# Patient Record
Sex: Male | Born: 1980 | Race: White | Hispanic: No | Marital: Married | Smoking: Current some day smoker
Health system: Southern US, Community
[De-identification: ages and names within clinical notes are randomized; demographics above are authoritative.]

---

## 2010-02-02 ENCOUNTER — Ambulatory Visit: Payer: Self-pay | Admitting: Internal Medicine

## 2011-04-18 ENCOUNTER — Ambulatory Visit: Payer: Self-pay | Admitting: Gastroenterology

## 2013-12-26 ENCOUNTER — Ambulatory Visit: Payer: Self-pay | Admitting: Neurology

## 2017-11-20 DIAGNOSIS — I456 Pre-excitation syndrome: Secondary | ICD-10-CM | POA: Insufficient documentation

## 2017-11-20 DIAGNOSIS — Z9889 Other specified postprocedural states: Secondary | ICD-10-CM | POA: Insufficient documentation

## 2018-04-27 DIAGNOSIS — S8011XA Contusion of right lower leg, initial encounter: Secondary | ICD-10-CM | POA: Insufficient documentation

## 2020-11-03 ENCOUNTER — Other Ambulatory Visit: Payer: Self-pay

## 2020-11-03 DIAGNOSIS — Z20822 Contact with and (suspected) exposure to covid-19: Secondary | ICD-10-CM

## 2020-11-06 LAB — NOVEL CORONAVIRUS, NAA: SARS-CoV-2, NAA: DETECTED — AB

## 2021-02-24 ENCOUNTER — Other Ambulatory Visit: Payer: Self-pay

## 2021-02-24 ENCOUNTER — Emergency Department
Admission: EM | Admit: 2021-02-24 | Discharge: 2021-02-24 | Disposition: A | Discharge status: Home / Self Care | Payer: BC Managed Care – PPO | Attending: Emergency Medicine | Admitting: Emergency Medicine

## 2021-02-24 ENCOUNTER — Emergency Department: Payer: BC Managed Care – PPO

## 2021-02-24 ENCOUNTER — Encounter: Payer: Self-pay | Admitting: Intensive Care

## 2021-02-24 DIAGNOSIS — H538 Other visual disturbances: Secondary | ICD-10-CM | POA: Diagnosis not present

## 2021-02-24 DIAGNOSIS — R499 Unspecified voice and resonance disorder: Secondary | ICD-10-CM | POA: Diagnosis not present

## 2021-02-24 DIAGNOSIS — R55 Syncope and collapse: Secondary | ICD-10-CM | POA: Diagnosis not present

## 2021-02-24 DIAGNOSIS — F17290 Nicotine dependence, other tobacco product, uncomplicated: Secondary | ICD-10-CM | POA: Insufficient documentation

## 2021-02-24 LAB — BASIC METABOLIC PANEL
Anion gap: 8 (ref 5–15)
BUN: 12 mg/dL (ref 6–20)
CO2: 25 mmol/L (ref 22–32)
Calcium: 9.9 mg/dL (ref 8.9–10.3)
Chloride: 104 mmol/L (ref 98–111)
Creatinine, Ser: 1.06 mg/dL (ref 0.61–1.24)
GFR, Estimated: >60 mL/min (ref >60)
Glucose, Bld: 110 mg/dL — ABNORMAL HIGH (ref 70–99)
Potassium: 4.2 mmol/L (ref 3.5–5.1)
Sodium: 137 mmol/L (ref 135–145)

## 2021-02-24 LAB — URINALYSIS, COMPLETE (UACMP) WITH MICROSCOPIC
Bacteria, UA: NONE SEEN
Bilirubin Urine: NEGATIVE
Glucose, UA: NEGATIVE mg/dL
Hgb urine dipstick: NEGATIVE
Ketones, ur: NEGATIVE mg/dL
Leukocytes,Ua: NEGATIVE
Nitrite: NEGATIVE
Protein, ur: NEGATIVE mg/dL
Specific Gravity, Urine: 1.016 (ref 1.005–1.030)
Squamous Epithelial / HPF: NONE SEEN (ref 0–5)
pH: 6 (ref 5.0–8.0)

## 2021-02-24 LAB — CBC
HCT: 47.3 % (ref 39.0–52.0)
Hemoglobin: 16.2 g/dL (ref 13.0–17.0)
MCH: 29.4 pg (ref 26.0–34.0)
MCHC: 34.2 g/dL (ref 30.0–36.0)
MCV: 85.8 fL (ref 80.0–100.0)
Platelets: 218 10*3/uL (ref 150–400)
RBC: 5.51 MIL/uL (ref 4.22–5.81)
RDW: 11.5 % (ref 11.5–15.5)
WBC: 4.5 10*3/uL (ref 4.0–10.5)
nRBC: 0 % (ref 0.0–0.2)

## 2021-02-24 LAB — TROPONIN I (HIGH SENSITIVITY)
Troponin I (High Sensitivity): <2 ng/L (ref <18)
Troponin I (High Sensitivity): 2 ng/L (ref <18)

## 2021-02-24 NOTE — ED Notes (Signed)
See trige note. Pt c/o knee pain from the fall. No bleeding or obvious deformity noted. Pt ambulatory to bathroom/bed. Wife at bedside. Pt states he feels better after getting IVF with EMS.

## 2021-02-24 NOTE — ED Provider Notes (Signed)
[facility] Emergency Department Provider Note  ____________________________________________   Event Date/Time   First MD Initiated Contact with Patient 02/24/21 1204     (approximate)  I have reviewed the triage vital signs and the nursing notes.   HISTORY  Chief Complaint Loss of Consciousness   HPI Norman Valenzuela is a 40 y.o. male presents to the ED via EMS after a witnessed near syncopal episode at church.  Patient states that he was standing singing in church when he began feeling different.  Patient states he started seeing double and did not feel like himself.  He also states that voices were muffled before and he was able to walk out of the church because he did not feel well.  He was noted to go down to his knees where church members were with him and prevented him from hitting his head.  Patient states that he did not eat breakfast this morning and has no problems with diabetes, high blood pressure or cardiac history.  He denies any headache, shortness of breath, chest pain, dizziness, blurred vision at this time.         History reviewed. No pertinent past medical history.  There are no problems to display for this patient.   History reviewed. No pertinent surgical history.  Prior to Admission medications   Not on File    Allergies Morphine and related  History reviewed. No pertinent family history.  Social History Social History   Tobacco Use  . Smoking status: Current Some Day Smoker    Types: Cigars  . Smokeless tobacco: Never Used  Substance Use Topics  . Alcohol use: Yes    Comment: occ  . Drug use: Never    Review of Systems Constitutional: No fever/chills Eyes: No visual changes.  Initially muffled sounded voices. ENT: No sore throat. Cardiovascular: Denies chest pain. Respiratory: Denies shortness of breath.  Negative for cough. Gastrointestinal: No abdominal pain.  No nausea, no vomiting.  No diarrhea.  No  constipation. Genitourinary: Negative for dysuria. Musculoskeletal: Negative for musculoskeletal pain. Skin: Negative for rash. Neurological: Negative for headaches, focal weakness or numbness. ____________________________________________   PHYSICAL EXAM:  VITAL SIGNS: ED Triage Vitals [02/24/21 1037]  Enc Vitals Group     BP (!) 123/96     Pulse Rate 80     Resp 16     Temp (!) 97.5 F (36.4 C)     Temp Source Oral     SpO2 100 %     Weight 180 lb (81.6 kg)     Height 5\' 11"  (1.803 m)     Head Circumference      Peak Flow      Pain Score 0     Pain Loc      Pain Edu?      Excl. in GC?    Constitutional: Alert and oriented. Well appearing and in no acute distress.  Patient is unable to talk in complete sentences without any difficulties. Eyes: Conjunctivae are normal. PERRL. EOMI. no nystagmus noted. Head: Atraumatic. Nose: No congestion/rhinnorhea. Mouth/Throat: Mucous membranes are moist.  Oropharynx non-erythematous. Neck: No stridor.   Cardiovascular: Normal rate, regular rhythm. Grossly normal heart sounds.  Good peripheral circulation. Respiratory: Normal respiratory effort.  No retractions. Lungs CTAB. Gastrointestinal: Soft and nontender. No distention.  Musculoskeletal: Moves upper and lower extremities without any difficulty.  No crepitus is noted on palpation and range of motion of the knees bilaterally.  Normal gait was noted. Neurologic:  Normal speech and language. No gross focal neurologic deficits are appreciated. No gait instability. Skin:  Skin is warm, dry and intact. No rash noted. Psychiatric: Mood and affect are normal. Speech and behavior are normal.  ____________________________________________   LABS (all labs ordered are listed, but only abnormal results are displayed)  Labs Reviewed  BASIC METABOLIC PANEL - Abnormal; Notable for the following components:      Result Value   Glucose, Bld 110 (*)    All other components within normal limits   URINALYSIS, COMPLETE (UACMP) WITH MICROSCOPIC - Abnormal; Notable for the following components:   Color, Urine YELLOW (*)    APPearance CLEAR (*)    All other components within normal limits  CBC  TROPONIN I (HIGH SENSITIVITY)  TROPONIN I (HIGH SENSITIVITY)   ____________________________________________  EKG  Normal sinus rhythm with ventricular rate of 72.  PR interval 142. ____________________________________________  RADIOLOGY Beaulah Corin, personally viewed and evaluated these images (plain radiographs) as part of my medical decision making, as well as reviewing the written report by the radiologist.   Official radiology report(s): CT Head Wo Contrast  Result Date: 02/24/2021 CLINICAL DATA:  Recurrent syncope. Witnessed syncopal episode at church today. EXAM: CT HEAD WITHOUT CONTRAST TECHNIQUE: Contiguous axial images were obtained from the base of the skull through the vertex without intravenous contrast. COMPARISON:  Brain MRI, 12/26/2013. FINDINGS: Brain: No evidence of acute infarction, hemorrhage, hydrocephalus, extra-axial collection or mass lesion/mass effect. Vascular: No hyperdense vessel or unexpected calcification. Skull: Normal. Negative for fracture or focal lesion. Sinuses/Orbits: Globes and orbits are unremarkable. Visualized sinuses are clear. Other: None. IMPRESSION: Normal unenhanced CT scan of the brain. Electronically Signed   By: Amie Portland M.D.   On: 02/24/2021 12:43    ____________________________________________   PROCEDURES  Procedure(s) performed (including Critical Care):  Procedures   ____________________________________________   INITIAL IMPRESSION / ASSESSMENT AND PLAN / ED COURSE  As part of my medical decision making, I reviewed the following data within the electronic MEDICAL RECORD NUMBER Notes from prior ED visits and [place] Controlled Substance Database  40 year old male presents to the ED after a near syncopal episode that occurred  at church today.  Patient states that initially he felt sick, voices were muffled, has some blurred vision while standing singing in church.  Patient was able to walk out of the church where he landed on his knees and hurts members prevented him from hitting his head.  Wife states that church member said that this episode may have lasted all of 20 seconds.  Patient states that he ate breakfast with his children this morning but mostly was all carbs.  He has not had difficulty with his blood sugar in the past and EMS reported that his blood sugar was normal at the scene.  Labs were normal in the ED with his glucose being 110.  2 sets troponin was less than 2.  CT head scan was negative.  EKG was unremarkable.  Patient was feeling much better and was ambulatory without any assistance.  Patient was made aware that should he develop any symptoms or urgent concerns to return to the emergency department immediately.  He will follow-up with his primary care provider for any continued concerns.  ____________________________________________   FINAL CLINICAL IMPRESSION(S) / ED DIAGNOSES  Final diagnoses:  Near syncope     ED Discharge Orders    None      *Please note:  Norman Valenzuela was evaluated in Emergency Department  on 02/24/2021 for the symptoms described in the history of present illness. He was evaluated in the context of the global COVID-19 pandemic, which necessitated consideration that the patient might be at risk for infection with the SARS-CoV-2 virus that causes COVID-19. Institutional protocols and algorithms that pertain to the evaluation of patients at risk for COVID-19 are in a state of rapid change based on information released by regulatory bodies including the CDC and federal and state organizations. These policies and algorithms were followed during the patient's care in the ED.  Some ED evaluations and interventions may be delayed as a result of limited staffing during and the  pandemic.*   Note:  This document was prepared using Dragon voice recognition software and may include unintentional dictation errors.    Tommi Rumps, PA-C 02/24/21 1450    Delton Prairie, MD 02/24/21 [phone]

## 2021-02-24 NOTE — ED Triage Notes (Signed)
Patient arrived by EMS after having witnessed syncopal episode at church. Reports he started seeing double and didn't feel himself and voices became mumbles before it happened. Reports recent anxiety thinking about his father who has passed away. Denies hitting head. Church member lowered patient to the ground reporting he did hit his knees.

## 2021-02-24 NOTE — ED Notes (Signed)
Lab called at this time to add on troponin. °

## 2021-02-24 NOTE — Discharge Instructions (Addendum)
Follow-up with your primary care provider if any continued problems or concerns.  Lab work today was reassuring as well as your CT scan and you had to test called a troponin which was completely normal.  Drink plenty of fluids today.  Try to eat more protein in the mornings for breakfast to see if this helps.  Return to the emergency department immediately if any sudden worsening of your symptoms or urgent concerns.

## 2021-02-24 NOTE — ED Notes (Signed)
Pt with CT. 

## 2021-11-13 ENCOUNTER — Ambulatory Visit: Payer: BC Managed Care – PPO | Admitting: Urology

## 2021-11-13 ENCOUNTER — Encounter: Payer: Self-pay | Admitting: Urology

## 2021-11-13 ENCOUNTER — Other Ambulatory Visit: Payer: Self-pay

## 2021-11-13 VITALS — BP 124/73 | HR 66 | Ht 71.0 in | Wt 178.0 lb

## 2021-11-13 DIAGNOSIS — N529 Male erectile dysfunction, unspecified: Secondary | ICD-10-CM | POA: Diagnosis not present

## 2021-11-13 DIAGNOSIS — R6882 Decreased libido: Secondary | ICD-10-CM

## 2021-11-13 MED ORDER — TADALAFIL 5 MG PO TABS: 5.0000 mg | ORAL_TABLET | Freq: Every day | ORAL | 3 refills | Status: DC | PRN

## 2021-11-13 NOTE — Progress Notes (Signed)
° °  11/13/21 3:49 PM   Stefan [employer] [birth year] [id number]  CC: Erectile dysfunction, decreased libido  HPI: Healthy 41 year old male married with 2 children who reports about a year of trouble with erections as well as decreased libido.  No prior testosterone values to review.  He has not tried any medications for this.    Social History:  reports that he has been smoking cigars. He has never used smokeless tobacco. He reports current alcohol use. He reports that he does not use drugs.  Physical Exam: BP 124/73    Pulse 66    Ht 5\' 11"  (1.803 m)    Wt 178 lb (80.7 kg)    BMI 24.83 kg/m    Constitutional:  Alert and oriented, No acute distress. Cardiovascular: No clubbing, cyanosis, or edema. Respiratory: Normal respiratory effort, no increased work of breathing. GI: Abdomen is soft, nontender, nondistended, no abdominal masses  Assessment & Plan:   41 year old male with about 1 year of difficulty with erections as well as decreased libido.  We reviewed the AUA guidelines regarding testosterone evaluation and management, as well as erectile dysfunction.  I recommended a morning testosterone value, and will call with those results, as well as a trial of Cialis 5 mg daily.  Risks and benefits discussed.  Trial of Cialis 5 mg daily Call with testosterone results RTC 4 to 6 weeks symptom check  Nickolas Madrid, MD 11/13/2021  [facility] [address], [place], [place] [address] [phone]

## 2021-11-13 NOTE — Patient Instructions (Signed)
Erectile Dysfunction °Erectile dysfunction (ED) is the inability to get or keep an erection in order to have sexual intercourse. ED is considered a symptom of an underlying disorder and is not considered a disease. ED may include: °Inability to get an erection. °Lack of enough hardness of the erection to allow penetration. °Loss of erection before sex is finished. °What are the causes? °This condition may be caused by: °Physical causes, such as: °Artery problems. This may include heart disease, high blood pressure, atherosclerosis, and diabetes. °Hormonal problems, such as low testosterone. °Obesity. °Nerve problems. This may include back or pelvic injuries, multiple sclerosis, Parkinson's disease, spinal cord injury, and stroke. °Certain medicines, such as: °Pain relievers. °Antidepressants. °Blood pressure medicines and water pills (diuretics). °Cancer medicines. °Antihistamines. °Muscle relaxants. °Lifestyle factors, such as: °Use of drugs such as marijuana, cocaine, or opioids. °Excessive use of alcohol. °Smoking. °Lack of physical activity or exercise. °Psychological causes, such as: °Anxiety or stress. °Sadness or depression. °Exhaustion. °Fear about sexual performance. °Guilt. °What are the signs or symptoms? °Symptoms of this condition include: °Inability to get an erection. °Lack of enough hardness of the erection to allow penetration. °Loss of the erection before sex is finished. °Sometimes having normal erections, but with frequent unsatisfactory episodes. °Low sexual satisfaction in either partner due to erection problems. °A curved penis occurring with erection. The curve may cause pain, or the penis may be too curved to allow for intercourse. °Never having nighttime or morning erections. °How is this diagnosed? °This condition is often diagnosed by: °Performing a physical exam to find other diseases or specific problems with the penis. °Asking you detailed questions about the problem. °Doing tests,  such as: °Blood tests to check for diabetes mellitus or high cholesterol, or to measure hormone levels. °Other tests to check for underlying health conditions. °An ultrasound exam to check for scarring. °A test to check blood flow to the penis. °Doing a sleep study at home to measure nighttime erections. °How is this treated? °This condition may be treated by: °Medicines, such as: °Medicine taken by mouth to help you achieve an erection (oral medicine). °Hormone replacement therapy to replace low testosterone levels. °Medicine that is injected into the penis. Your health care provider may instruct you how to give yourself these injections at home. °Medicine that is delivered with a short applicator tube. The tube is inserted into the opening at the tip of the penis, which is the opening of the urethra. A tiny pellet of medicine is put in the urethra. The pellet dissolves and enhances erectile function. This is also called MUSE (medicated urethral system for erections) therapy. °Vacuum pump. This is a pump with a ring on it. The pump and ring are placed on the penis and used to create pressure that helps the penis become erect. °Penile implant surgery. In this procedure, you may receive: °An inflatable implant. This consists of cylinders, a pump, and a reservoir. The cylinders can be inflated with a fluid that helps to create an erection, and they can be deflated after intercourse. °A semi-rigid implant. This consists of two silicone rubber rods. The rods provide some rigidity. They are also flexible, so the penis can both curve downward in its normal position and become straight for sexual intercourse. °Blood vessel surgery to improve blood flow to the penis. During this procedure, a blood vessel from a different part of the body is placed into the penis to allow blood to flow around (bypass) damaged or blocked blood vessels. °Lifestyle changes,   such as exercising more, losing weight, and quitting smoking. °Follow  these instructions at home: °Medicines ° °Take over-the-counter and prescription medicines only as told by your health care provider. Do not increase the dosage without first discussing it with your health care provider. °If you are using self-injections, do injections as directed by your health care provider. Make sure you avoid any veins that are on the surface of the penis. After giving an injection, apply pressure to the injection site for 5 minutes. °Talk to your health care provider about how to prevent headaches while taking ED medicines. These medicines may cause a sudden headache due to the increase in blood flow in your body. °General instructions °Exercise regularly, as directed by your health care provider. Work with your health care provider to lose weight, if needed. °Do not use any products that contain nicotine or tobacco. These products include cigarettes, chewing tobacco, and vaping devices, such as e-cigarettes. If you need help quitting, ask your health care provider. °Before using a vacuum pump, read the instructions that come with the pump and discuss any questions with your health care provider. °Keep all follow-up visits. This is important. °Contact a health care provider if: °You feel nauseous. °You are vomiting. °You get sudden headaches while taking ED medicines. °You have any concerns about your sexual health. °Get help right away if: °You are taking oral or injectable medicines and you have an erection that lasts longer than 4 hours. If your health care provider is unavailable, go to the nearest emergency room for evaluation. An erection that lasts much longer than 4 hours can result in permanent damage to your penis. °You have severe pain in your groin or abdomen. °You develop redness or severe swelling of your penis. °You have redness spreading at your groin or lower abdomen. °You are unable to urinate. °You experience chest pain or a rapid heartbeat (palpitations) after taking oral  medicines. °These symptoms may represent a serious problem that is an emergency. Do not wait to see if the symptoms will go away. Get medical help right away. Call your local emergency services (911 in the [place] Do not drive yourself to the hospital. °Summary °Erectile dysfunction (ED) is the inability to get or keep an erection during sexual intercourse. °This condition is diagnosed based on a physical exam, your symptoms, and tests to determine the cause. Treatment varies depending on the cause and may include medicines, hormone therapy, surgery, or a vacuum pump. °You may need follow-up visits to make sure that you are using your medicines or devices correctly. °Get help right away if you are taking or injecting medicines and you have an erection that lasts longer than 4 hours. °This information is not intended to replace advice given to you by your health care provider. Make sure you discuss any questions you have with your health care provider. °Document Revised: 01/02/2021 Document Reviewed: 01/02/2021 °Elsevier Patient Education © 2022 [employer] Inc. ° °

## 2021-12-11 ENCOUNTER — Other Ambulatory Visit: Payer: Self-pay

## 2021-12-11 DIAGNOSIS — N529 Male erectile dysfunction, unspecified: Secondary | ICD-10-CM

## 2021-12-11 DIAGNOSIS — R6882 Decreased libido: Secondary | ICD-10-CM

## 2021-12-17 ENCOUNTER — Other Ambulatory Visit: Payer: BC Managed Care – PPO

## 2021-12-17 ENCOUNTER — Other Ambulatory Visit: Payer: Self-pay

## 2021-12-17 DIAGNOSIS — R6882 Decreased libido: Secondary | ICD-10-CM

## 2021-12-17 DIAGNOSIS — N529 Male erectile dysfunction, unspecified: Secondary | ICD-10-CM

## 2021-12-18 LAB — TESTOSTERONE: Testosterone: 401 ng/dL (ref 264–916)

## 2021-12-26 ENCOUNTER — Ambulatory Visit: Payer: BC Managed Care – PPO | Admitting: Urology

## 2021-12-26 ENCOUNTER — Other Ambulatory Visit: Payer: Self-pay

## 2021-12-26 ENCOUNTER — Encounter: Payer: Self-pay | Admitting: Urology

## 2021-12-26 VITALS — Ht 72.0 in | Wt 180.0 lb

## 2021-12-26 DIAGNOSIS — N529 Male erectile dysfunction, unspecified: Secondary | ICD-10-CM

## 2021-12-26 NOTE — Progress Notes (Signed)
? ?  12/26/2021 ?2:39 PM  ? ?Norman Valenzuela ?05/09/1981 ?[id number] ? ?Reason for visit: Follow up ED, decreased libido ? ?HPI: ?Healthy 41 year old male who I saw in January 2023 for erectile dysfunction as well as decreased libido.  We checked a testosterone which came back normal at 401, and he opted for trial of daily 5 mg Cialis.  He has noticed significant improvement on this medication, and since the erections has improved he notices that his libido has also improved.  He would like to continue this medication. ? ?Continue Cialis 5 mg daily ?RTC 1 year symptom check ? ?Sondra Come, MD ? ?[facility] ?[address], [address] ?[place], [place] [address] ?[phone] ? ? ?

## 2022-11-21 IMAGING — CT CT HEAD W/O CM
3 series · 16 of 47 positions shown, 19 images · non-contrast
Comparison: Brain MRI, 12/26/2013.

CLINICAL DATA: Recurrent syncope. Witnessed syncopal episode at
church today.

EXAM:
CT HEAD WITHOUT CONTRAST
TECHNIQUE: Contiguous axial images were obtained from the base of the skull
through the vertex without intravenous contrast.

[Series 3: head wo · axial · 0.45mm/px · z∈[-115,+20]mm · 10 of 33 slices shown, 13 images]
[im 3/33  brain]
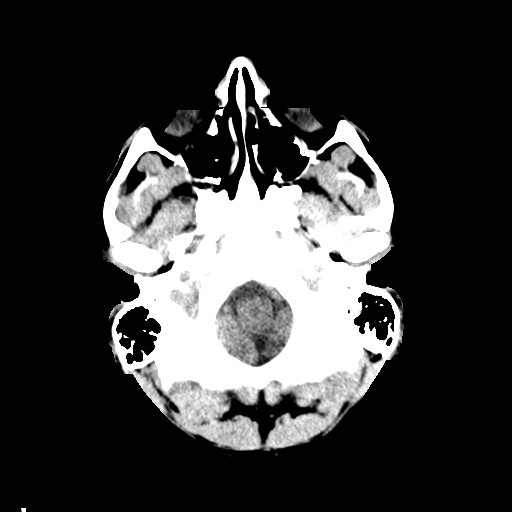
[im 3/33  bone]
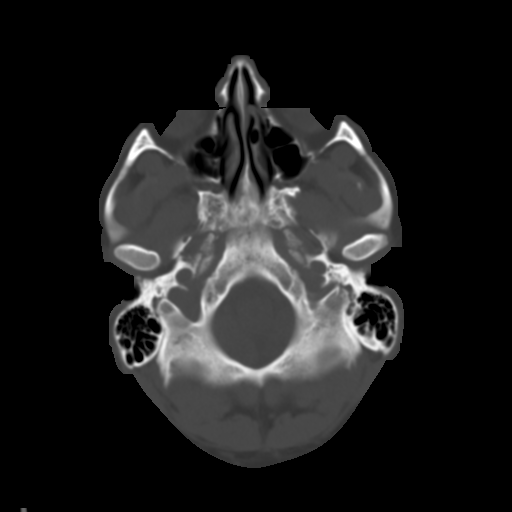
[im 6/33  brain]
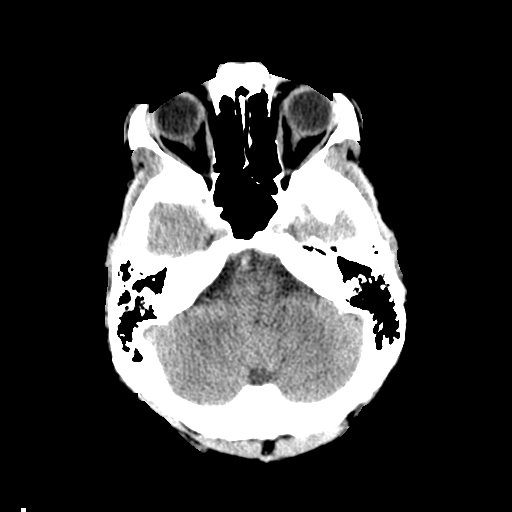
[im 9/33  brain]
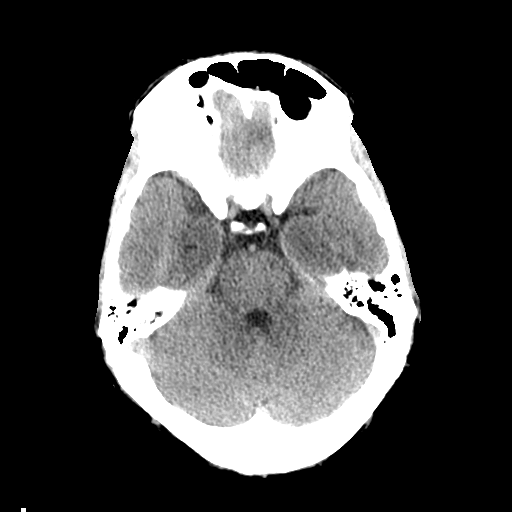
[im 12/33  brain]
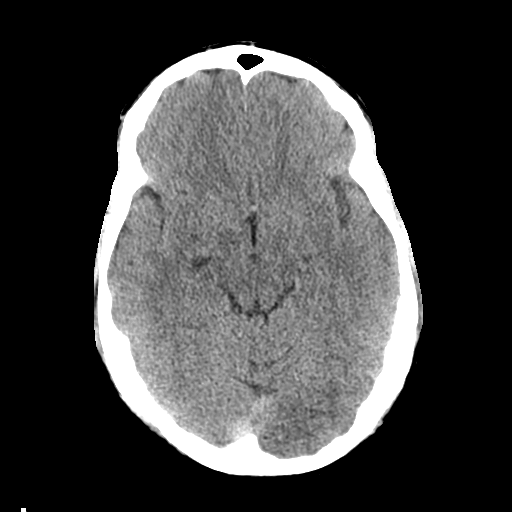
[im 15/33  brain]
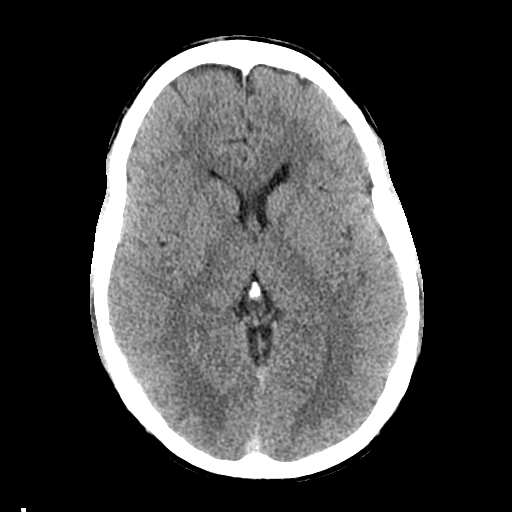
[im 15/33  bone]
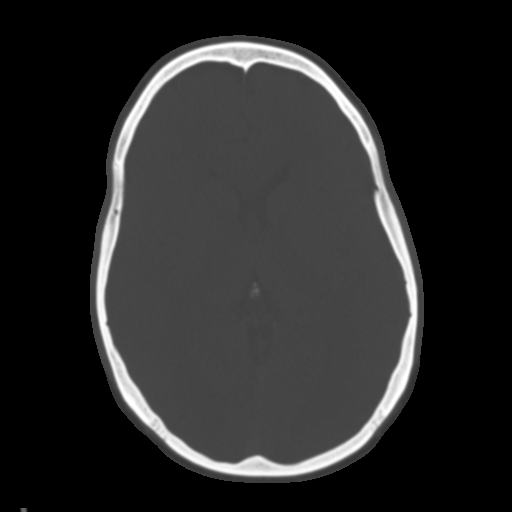
[im 18/33  brain]
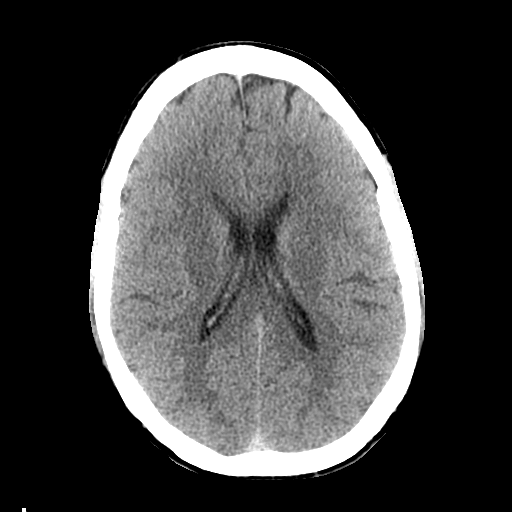
[im 21/33  brain]
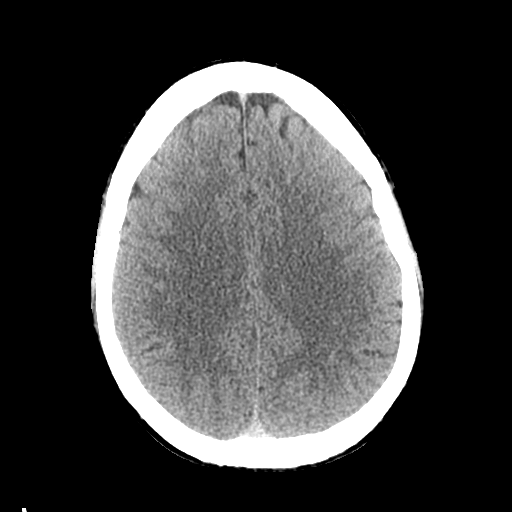
[im 25/33  brain]
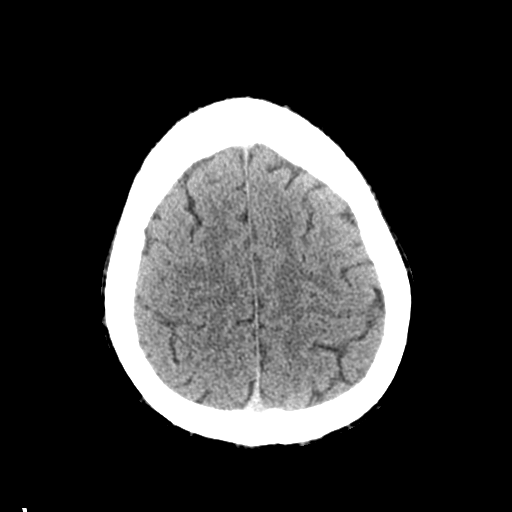
[im 27/33  brain]
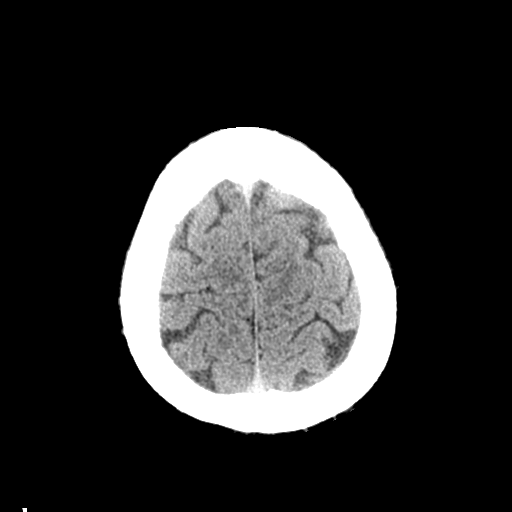
[im 27/33  bone]
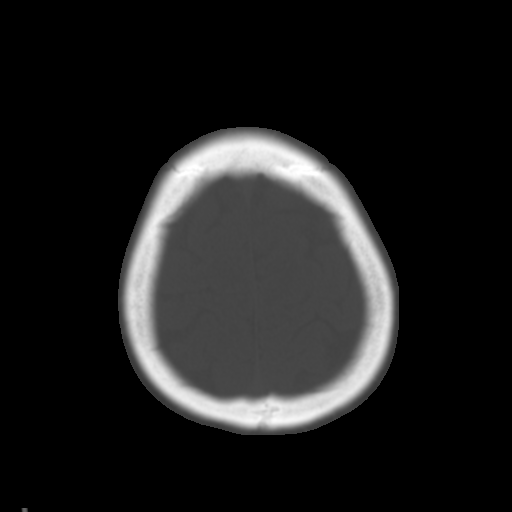
[im 30/33  brain]
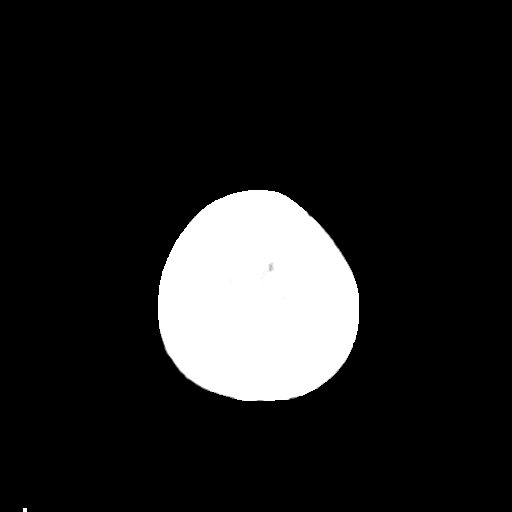

[Series 4: coronal soft tissue · coronal · 0.33mm/px · 3 of 75 slices shown]
[im 25/75  brain]
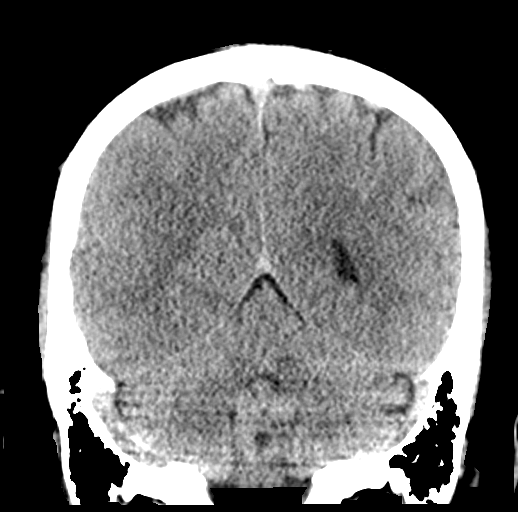
[im 33/75  brain]
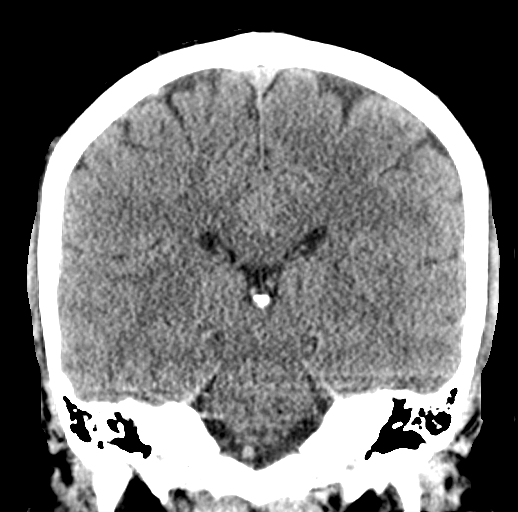
[im 42/75  brain]
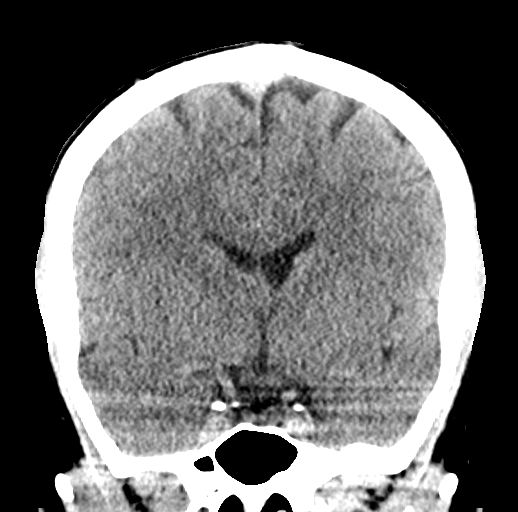

[Series 5: sagittal soft tissue · sagittal · 0.33mm/px · 3 of 57 slices shown]
[im 19/57  brain]
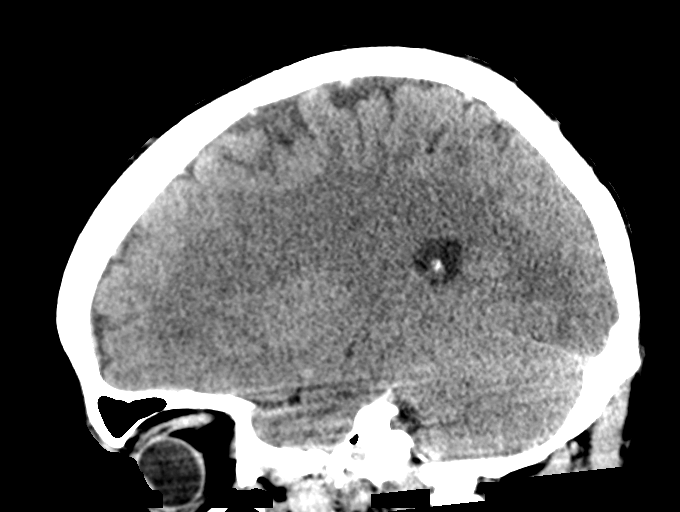
[im 29/57  brain]
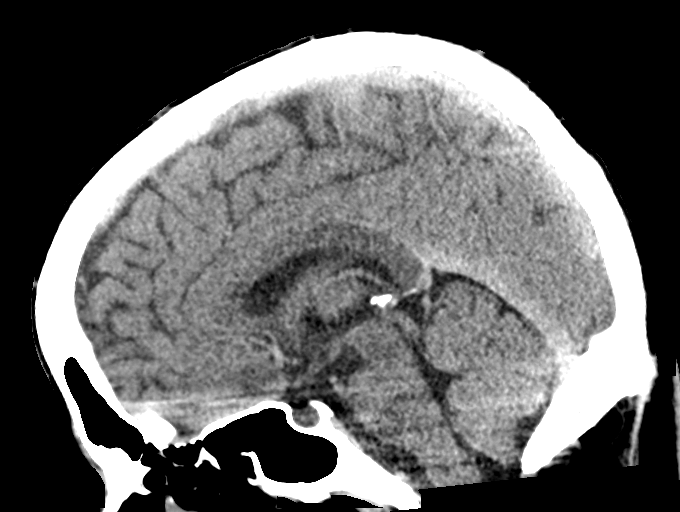
[im 38/57  brain]
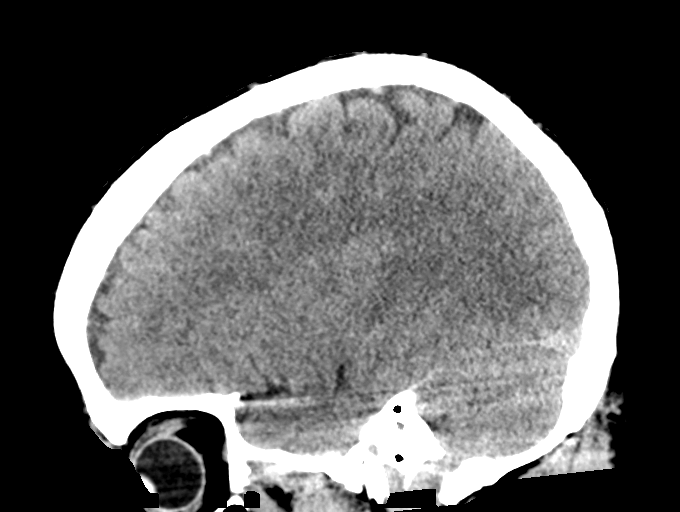

[16 of 47 positions shown; findings below may reference images not displayed]

FINDINGS: Brain: No evidence of acute infarction, hemorrhage, hydrocephalus,
extra-axial collection or mass lesion/mass effect.

Vascular: No hyperdense vessel or unexpected calcification.

Skull: Normal. Negative for fracture or focal lesion.

Sinuses/Orbits: Globes and orbits are unremarkable. Visualized
sinuses are clear.

Other: None.
IMPRESSION: Normal unenhanced CT scan of the brain.

## 2022-12-06 ENCOUNTER — Other Ambulatory Visit: Payer: Self-pay | Admitting: Urology

## 2022-12-25 ENCOUNTER — Ambulatory Visit: Payer: BC Managed Care – PPO | Admitting: Urology

## 2023-01-31 ENCOUNTER — Other Ambulatory Visit: Payer: Self-pay | Admitting: Urology

## 2023-02-13 ENCOUNTER — Ambulatory Visit: Payer: BC Managed Care – PPO | Admitting: Physician Assistant

## 2023-02-13 VITALS — BP 114/76 | HR 66 | Ht 72.0 in | Wt 180.0 lb

## 2023-02-13 DIAGNOSIS — N529 Male erectile dysfunction, unspecified: Secondary | ICD-10-CM

## 2023-02-13 MED ORDER — TADALAFIL 5 MG PO TABS: 5.0000 mg | ORAL_TABLET | Freq: Every day | ORAL | 5 refills | Status: DC | PRN

## 2023-02-13 NOTE — Progress Notes (Signed)
02/13/2023 9:36 AM   Norman Valenzuela Nov 02, 1980 [id number]  CC: Chief Complaint  Patient presents with   Follow-up   HPI: Norman Valenzuela is a 42 y.o. male with PMH erectile dysfunction on Cialis 5 mg daily who presents today for annual follow-up.   Today he reports he is only taking the Cialis as needed and reports his ED is well-managed on this.  He denies any new cardiac or urinary issues over the past year.  No chest pain, shortness of breath, or edema.  SHIM 20 as below.   SHIM     Row Name 02/13/23 0935         SHIM: Over the last 6 months:   How do you rate your confidence that you could get and keep an erection? High     When you had erections with sexual stimulation, how often were your erections hard enough for penetration (entering your partner)? Most Times (much more than half the time)     During sexual intercourse, how often were you able to maintain your erection after you had penetrated (entered) your partner? Sometimes (about half the time)     During sexual intercourse, how difficult was it to maintain your erection to completion of intercourse? Slightly Difficult     When you attempted sexual intercourse, how often was it satisfactory for you? Almost Always or Always       SHIM Total Score   SHIM 20               PMH: No past medical history on file.  Surgical History: No past surgical history on file.  Home Medications:  Allergies as of 02/13/2023       Reactions   Hornet Venom Swelling   Morphine    Other reaction(s): Other (See Comments) Other Reaction: VOMITTING   Morphine And Related         Medication List        Accurate as of February 13, 2023  9:36 AM. If you have any questions, ask your nurse or doctor.          tadalafil 5 MG tablet Commonly known as: CIALIS TAKE ONE TABLET BY MOUTH DAILY AS NEEDED FOR ERECTILE DYSFUNCTION        Allergies:  Allergies  Allergen Reactions   Hornet Venom Swelling   Morphine      Other reaction(s): Other (See Comments) Other Reaction: VOMITTING    Morphine And Related     Family History: No family history on file.  Social History:   reports that he has been smoking cigars. He has been exposed to tobacco smoke. He has never used smokeless tobacco. He reports current alcohol use. He reports that he does not use drugs.  Physical Exam: BP 114/76   Pulse 66   Ht 6' (1.829 m)   Wt 180 lb (81.6 kg)   BMI 24.41 kg/m   Constitutional:  Alert and oriented, no acute distress, nontoxic appearing HEENT: [place], AT Cardiovascular: No clubbing, cyanosis, or edema Respiratory: Normal respiratory effort, no increased work of breathing Skin: No rashes, bruises or suspicious lesions Neurologic: Grossly intact, no focal deficits, moving all 4 extremities Psychiatric: Normal mood and affect  Assessment & Plan:   1. Erectile dysfunction, unspecified erectile dysfunction type Will controlled on Cialis 5 mg as needed, will continue this. - tadalafil (CIALIS) 5 MG tablet; Take 1 tablet (5 mg total) by mouth daily as needed for erectile dysfunction.  Dispense: 30 tablet;  Refill: 5   Return in about 1 year (around 02/13/2024) for Annual ED follow-up with SHIM.  Carman Ching, PA-C  [facility] [place] [address] [place], [place] [address] [phone]

## 2023-06-04 ENCOUNTER — Encounter: Payer: Self-pay | Admitting: Urology

## 2024-01-16 ENCOUNTER — Other Ambulatory Visit: Payer: Self-pay | Admitting: Physician Assistant

## 2024-01-16 DIAGNOSIS — N529 Male erectile dysfunction, unspecified: Secondary | ICD-10-CM

## 2024-01-29 ENCOUNTER — Other Ambulatory Visit: Payer: Self-pay | Admitting: Physician Assistant

## 2024-01-29 DIAGNOSIS — N529 Male erectile dysfunction, unspecified: Secondary | ICD-10-CM

## 2024-02-11 ENCOUNTER — Ambulatory Visit: Payer: Self-pay | Admitting: Physician Assistant

## 2024-02-11 ENCOUNTER — Encounter: Payer: Self-pay | Admitting: Physician Assistant

## 2024-02-11 VITALS — BP 124/68 | HR 57 | Ht 72.0 in | Wt 180.0 lb

## 2024-02-11 DIAGNOSIS — N200 Calculus of kidney: Secondary | ICD-10-CM

## 2024-02-11 DIAGNOSIS — N529 Male erectile dysfunction, unspecified: Secondary | ICD-10-CM | POA: Diagnosis not present

## 2024-02-11 MED ORDER — ONDANSETRON 4 MG PO TBDP: 4.0000 mg | ORAL_TABLET | Freq: Three times a day (TID) | ORAL | 0 refills | Status: AC | PRN

## 2024-02-11 MED ORDER — OXYCODONE-ACETAMINOPHEN 5-325 MG PO TABS: 1.0000 | ORAL_TABLET | Freq: Four times a day (QID) | ORAL | 0 refills | Status: AC | PRN

## 2024-02-11 MED ORDER — TADALAFIL 10 MG PO TABS: 10.0000 mg | ORAL_TABLET | ORAL | 5 refills | Status: DC | PRN

## 2024-02-11 MED ORDER — TAMSULOSIN HCL 0.4 MG PO CAPS: 0.4000 mg | ORAL_CAPSULE | Freq: Every day | ORAL | 0 refills | Status: AC

## 2024-02-11 NOTE — Progress Notes (Signed)
 02/11/2024 5:33 PM   Norman Valenzuela Feb 20, 1981 [id number]  CC: Chief Complaint  Patient presents with   Erectile Dysfunction   HPI: Norman Valenzuela is a 43 y.o. male with PMH ED on Cialis  5 mg as needed who presents today for annual follow-up.   Today he reports an adequate erection quality on 5 mg demand dose Cialis .  He wonders about increasing the dose.  He has passed multiple kidney stones in the past several months.  His most recent CT scan at [facility] on 09/06/2023 showed a 3 mm right renal stone and 3 mm left upper pole stone.  He has not passed any additional stones since.  On the recommendation of his providers at [facility], he has been reducing sweet tea, fried foods, and pork and increasing his intake of water and citric acid containing fluids.  Notably, he had his first stone episode at age 5.  PMH: No past medical history on file.  Surgical History: No past surgical history on file.  Home Medications:  Allergies as of 02/11/2024       Reactions   Hornet Venom Swelling   Morphine    Other reaction(s): Other (See Comments) Other Reaction: VOMITTING   Morphine And Codeine         Medication List        Accurate as of February 11, 2024  5:33 PM. If you have any questions, ask your nurse or doctor.          ondansetron  4 MG disintegrating tablet Commonly known as: ZOFRAN -ODT Take 1 tablet (4 mg total) by mouth every 8 (eight) hours as needed for nausea or vomiting. Started by: Hena Ewalt   oxyCODONE -acetaminophen  5-325 MG tablet Commonly known as: PERCOCET/ROXICET Take 1-2 tablets by mouth every 6 (six) hours as needed for severe pain (pain score 7-10). Started by: Clide Remmers   tadalafil  10 MG tablet Commonly known as: CIALIS  Take 1 tablet (10 mg total) by mouth as needed for erectile dysfunction. What changed:  medication strength how much to take when to take this Changed by: Hayli Milligan   tamsulosin  0.4 MG Caps  capsule Commonly known as: FLOMAX  Take 1 capsule (0.4 mg total) by mouth daily. Started by: Sincere Berlanga        Allergies:  Allergies  Allergen Reactions   Hornet Venom Swelling   Morphine     Other reaction(s): Other (See Comments) Other Reaction: VOMITTING    Morphine And Codeine     Family History: No family history on file.  Social History:   reports that he has been smoking cigars. He has been exposed to tobacco smoke. He has never used smokeless tobacco. He reports current alcohol use. He reports that he does not use drugs.  Physical Exam: BP 124/68   Pulse (!) 57   Ht 6' (1.829 m)   Wt 180 lb (81.6 kg)   BMI 24.41 kg/m   Constitutional:  Alert and oriented, no acute distress, nontoxic appearing HEENT: [place], AT Cardiovascular: No clubbing, cyanosis, or edema Respiratory: Normal respiratory effort, no increased work of breathing Skin: No rashes, bruises or suspicious lesions Neurologic: Grossly intact, no focal deficits, moving all 4 extremities Psychiatric: Normal mood and affect  Assessment & Plan:   1. Erectile dysfunction, unspecified erectile dysfunction type (Primary) Inadequate erectile quality on 5 mg demand dose.  Okay to increase to 10 mg demand dose tadalafil . - tadalafil  (CIALIS ) 10 MG tablet; Take 1 tablet (10 mg total) by mouth as  needed for erectile dysfunction.  Dispense: 30 tablet; Refill: 5  2. Nephrolithiasis Anticipate bilateral small nonobstructing renal stones, though I cannot personally review outside imaging.  Will send in Flomax , Zofran , and Percocet for him to have on hand for future stone episodes.  I also offered him a metabolic workup especially in light of his young age at the time of stone onset.  He will discuss this with his wife and let us  know if he wishes to proceed.  Otherwise, I will see him back in a year with a KUB prior. - tamsulosin  (FLOMAX ) 0.4 MG CAPS capsule; Take 1 capsule (0.4 mg total) by mouth daily.   Dispense: 30 capsule; Refill: 0 - ondansetron  (ZOFRAN -ODT) 4 MG disintegrating tablet; Take 1 tablet (4 mg total) by mouth every 8 (eight) hours as needed for nausea or vomiting.  Dispense: 20 tablet; Refill: 0 - oxyCODONE -acetaminophen  (PERCOCET/ROXICET) 5-325 MG tablet; Take 1-2 tablets by mouth every 6 (six) hours as needed for severe pain (pain score 7-10).  Dispense: 12 tablet; Refill: 0   Return in about 1 year (around 02/10/2025) for Annual f/u with SHIM, KUB prior.  Kathreen Pare, PA-C  [facility]  [address], [address] [place], [place] [address] [phone]

## 2024-03-10 ENCOUNTER — Other Ambulatory Visit: Payer: Self-pay | Admitting: Physician Assistant

## 2024-03-10 DIAGNOSIS — N200 Calculus of kidney: Secondary | ICD-10-CM

## 2024-10-12 ENCOUNTER — Other Ambulatory Visit: Payer: Self-pay | Admitting: Physician Assistant

## 2024-10-12 DIAGNOSIS — N529 Male erectile dysfunction, unspecified: Secondary | ICD-10-CM

## 2025-02-10 ENCOUNTER — Ambulatory Visit: Admitting: Physician Assistant
